# Patient Record
Sex: Male | Born: 1985 | Race: White | Hispanic: No | Marital: Married | Smoking: Never smoker
Health system: Southern US, Community
[De-identification: ages and names within clinical notes are randomized; demographics above are authoritative.]

---

## 2006-05-18 ENCOUNTER — Ambulatory Visit: Payer: Self-pay | Admitting: Urology

## 2006-05-23 ENCOUNTER — Ambulatory Visit: Payer: Self-pay | Admitting: Urology

## 2006-05-26 ENCOUNTER — Ambulatory Visit: Payer: Self-pay | Admitting: Urology

## 2006-05-31 ENCOUNTER — Ambulatory Visit: Payer: Self-pay | Admitting: Urology

## 2006-06-24 ENCOUNTER — Ambulatory Visit: Payer: Self-pay | Admitting: Urology

## 2007-04-24 IMAGING — CR DG ABDOMEN 1V
1 series · 1 of 1 positions shown · non-contrast
Comparison: none

REASON FOR EXAM: NEPHROLITHIASIS PT NEED FILMS
COMMENTS:

[view not recorded]
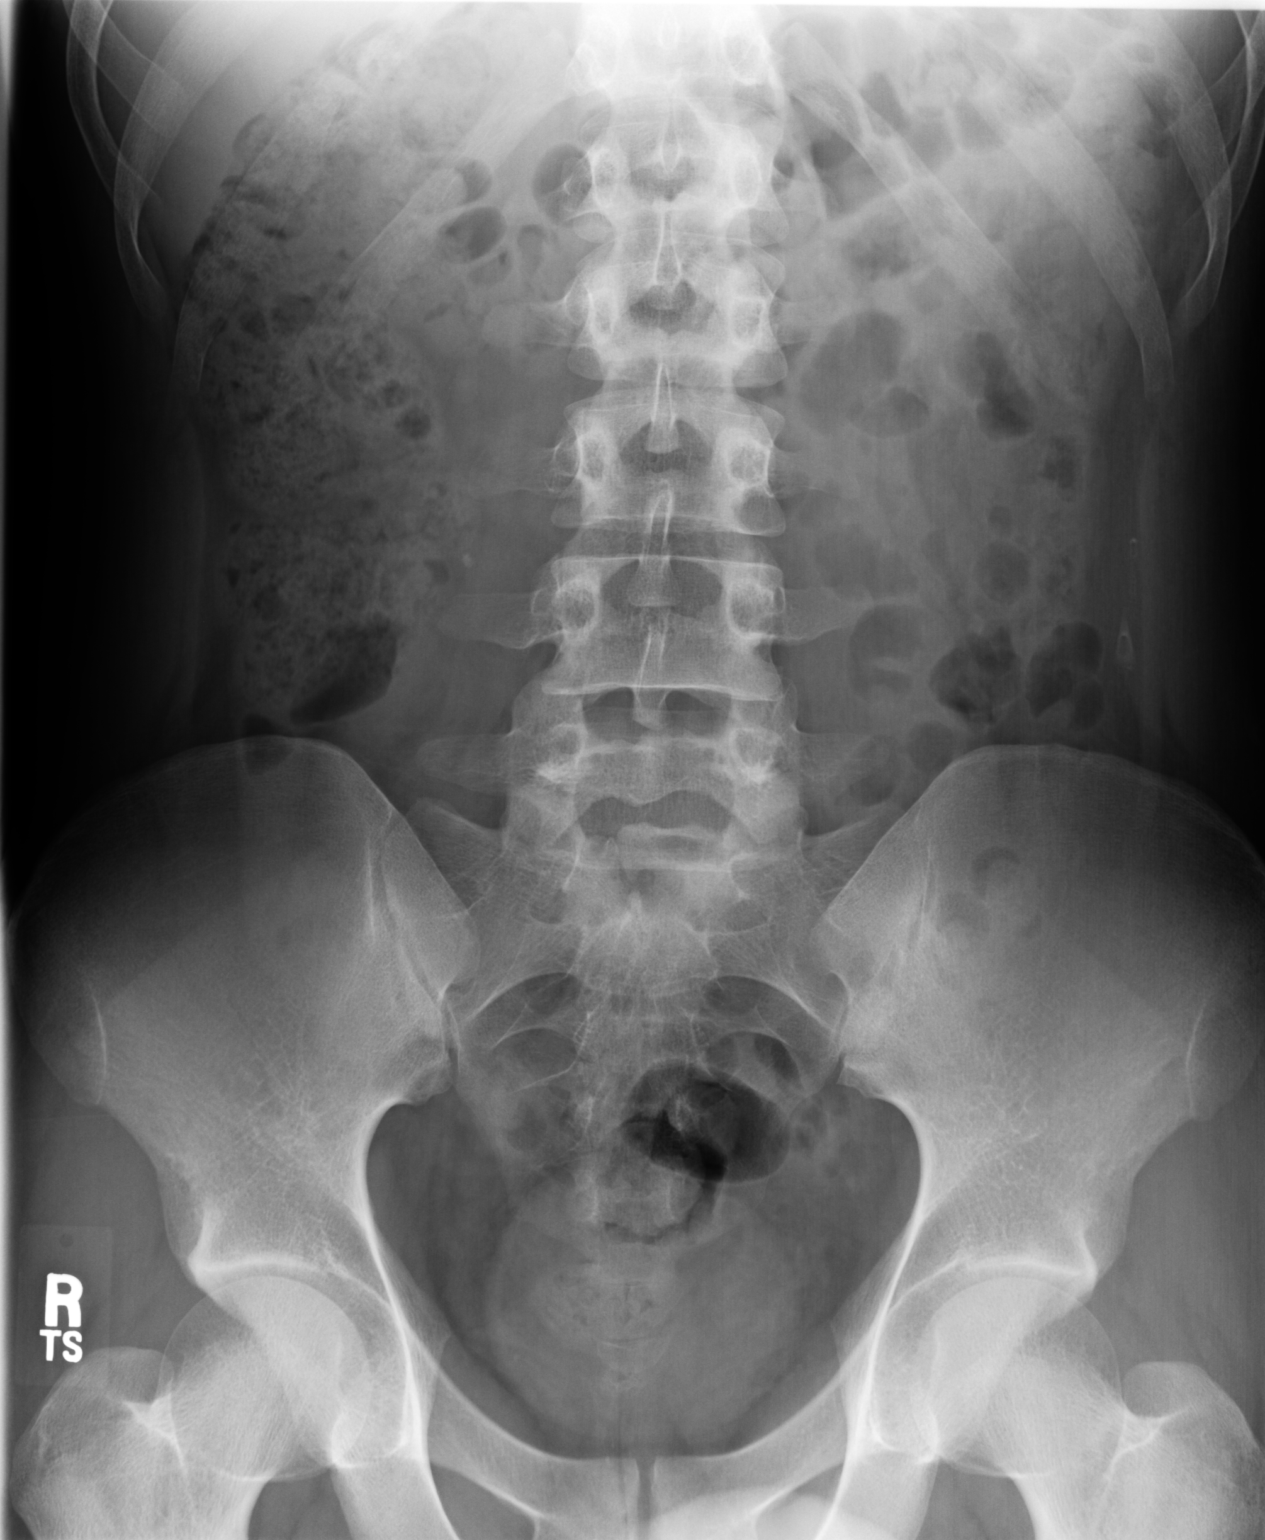

[1 of 1 positions shown; findings below may reference images not displayed]

PROCEDURE:     DXR - DXR KIDNEY URETER BLADDER  - May 18, 2006  [DATE]

RESULT:       AP view of the abdomen shows a tiny calcific density superior
to the  L4 lumbar transverse process on the RIGHT.  The etiology for this is
uncertain but the density could represent a tiny RIGHT ureteral stone.  The
RIGHT kidney is for the most part obscured by overlying bowel and bowel
content.  No definite RIGHT renal calyceal stones are seen but stones could
be present and obscured by bowel and bowel content.  There is similar
limitation and visualization of the LEFT renal area.  On this exam, no
definite LEFT renal stones are seen.
IMPRESSION: 1.     Possible proximal RIGHT ureteral stone.
2.     The kidneys are in great part obscured on this exam.  No definite
renal stones are seen but faint stones cannot be excluded.

## 2007-05-02 IMAGING — CR DG ABDOMEN 1V
1 series · 1 of 1 positions shown · non-contrast
Comparison: none

REASON FOR EXAM: renal calculi-lithotripsy
COMMENTS:

[view not recorded]
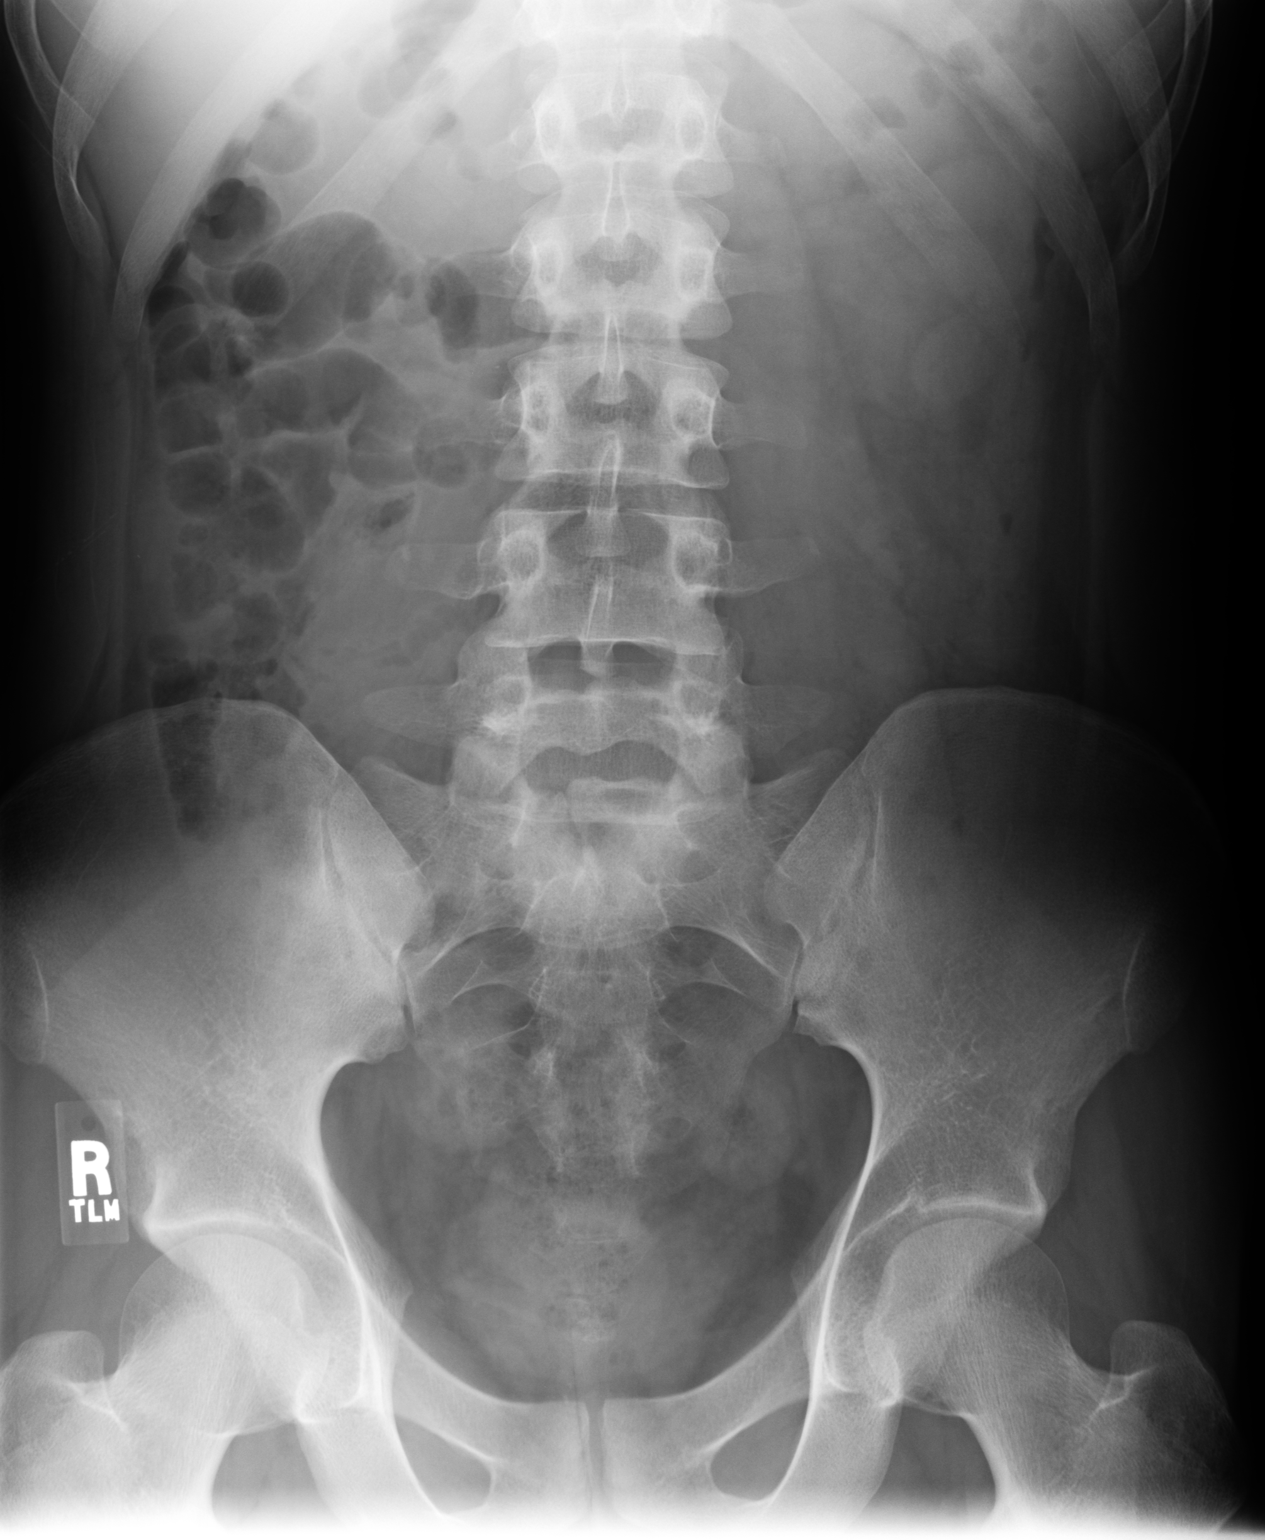

[1 of 1 positions shown; findings below may reference images not displayed]

PROCEDURE:     DXR - DXR KIDNEY URETER BLADDER  - May 26, 2006  [DATE]

RESULT:     Comparison is made to study 23 May, 2006.  The patient has
undergone lithotripsy.

There is a faint calcification lateral to the transverse process on the
RIGHT of L2.  This calcification measures approximately 1 cm in diameter.
There is a fainter calcification that projects over the RIGHT transverse
process of the fourth vertebral body.  I do not see definite calcifications
in the pelvis.
IMPRESSION: 1)There is calcification that projects in the region of the RIGHT renal
pelvis just lateral and inferior to the RIGHT transverse process of L2.  An
additional calcification projects over the RIGHT transverse process of L4.

## 2007-05-31 IMAGING — US US RENAL KIDNEY
1 series · 14 of 25 positions shown · non-contrast
Comparison: none

REASON FOR EXAM: Right renal calculus
COMMENTS:

[Series 1: us renal kidney · 0.27mm/px · 14 of 29 slices shown]
[im 1/29]
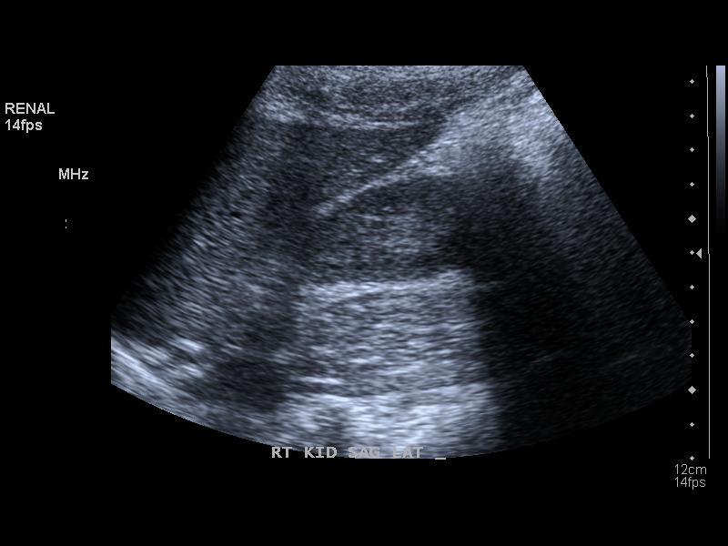
[im 3/29]
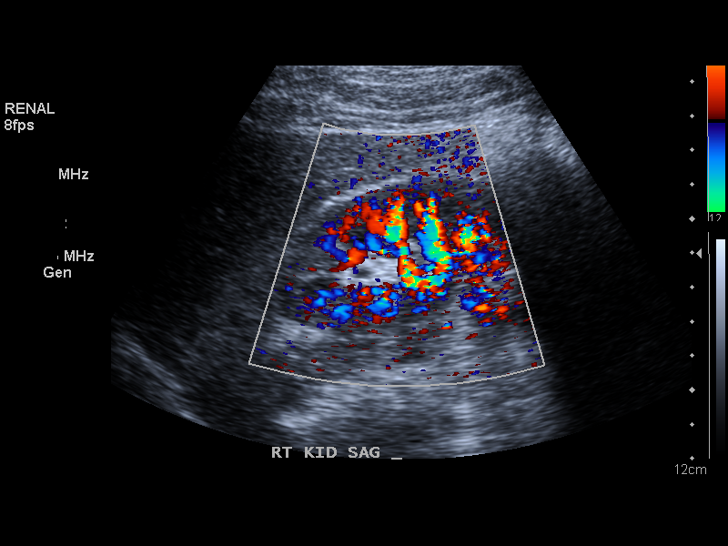
[im 5/29]
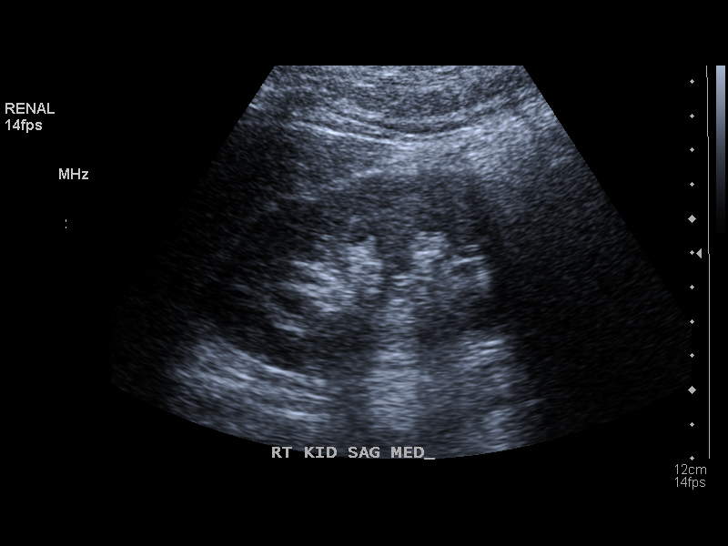
[im 8/29]
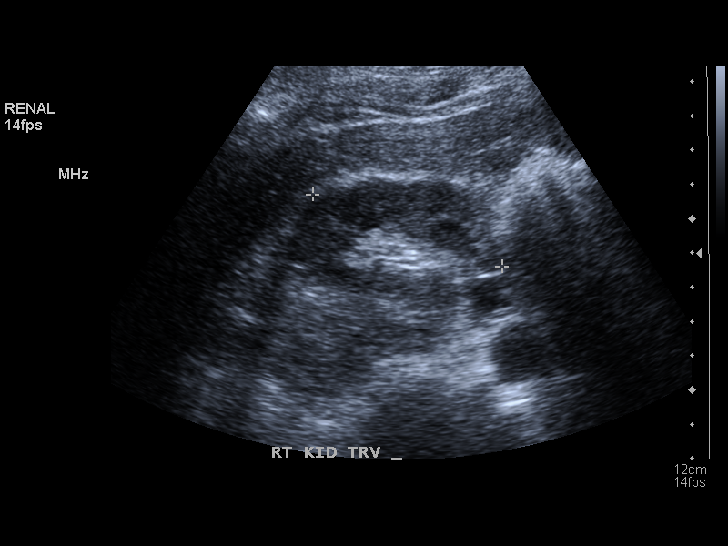
[im 10/29]
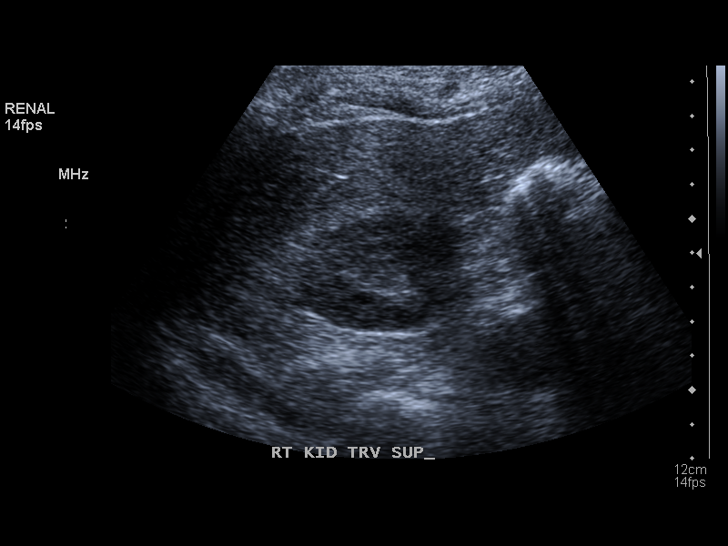
[im 11/29]
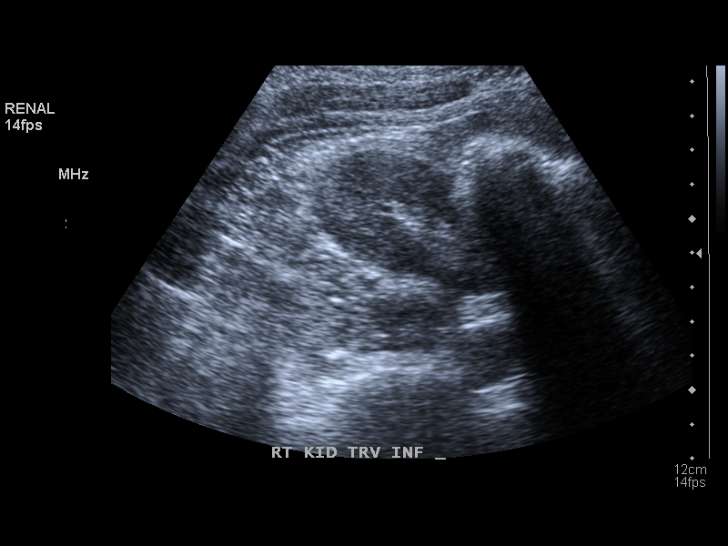
[im 13/29]
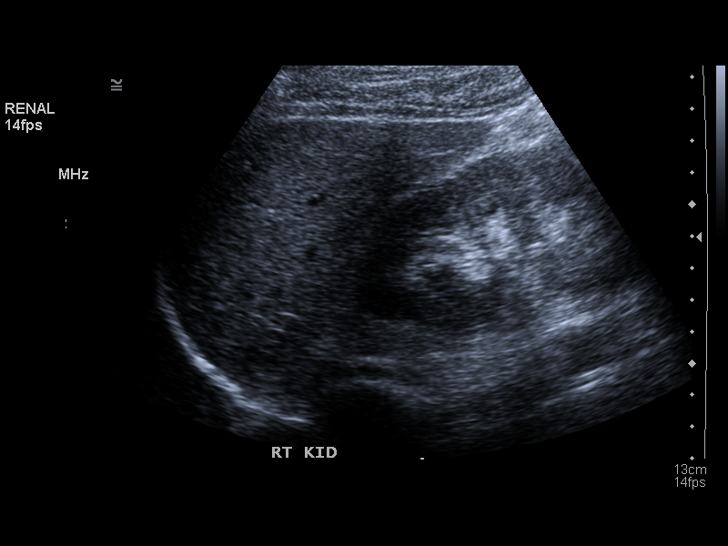
[im 16/29]
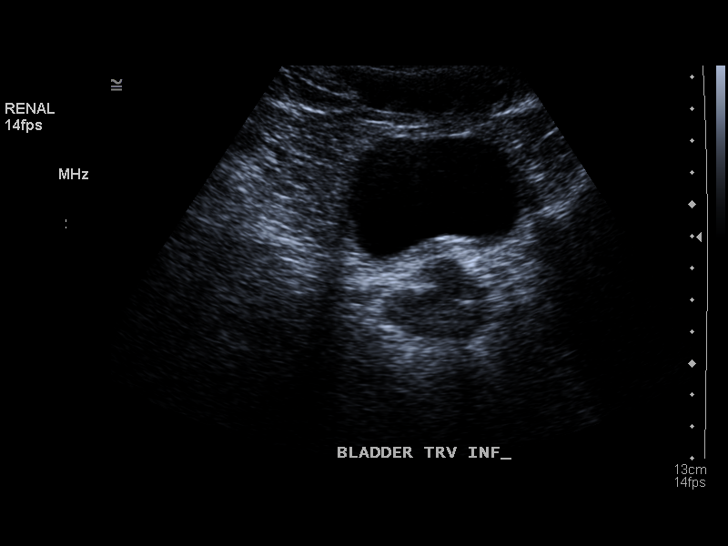
[im 18/29]
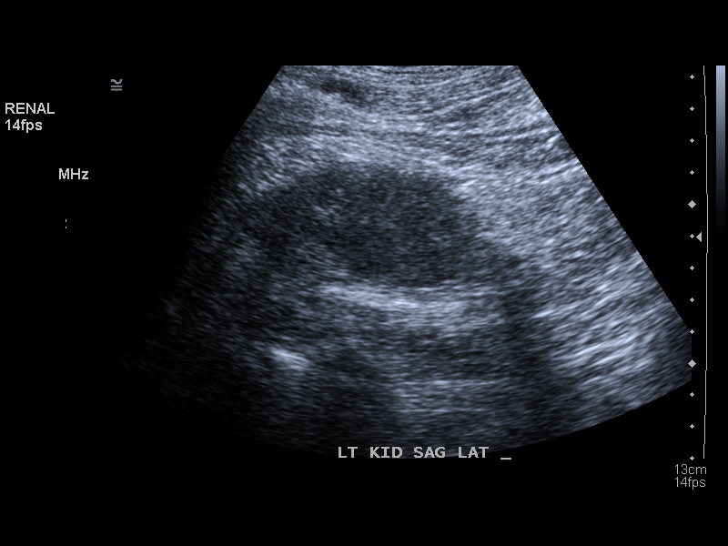
[im 19/29]
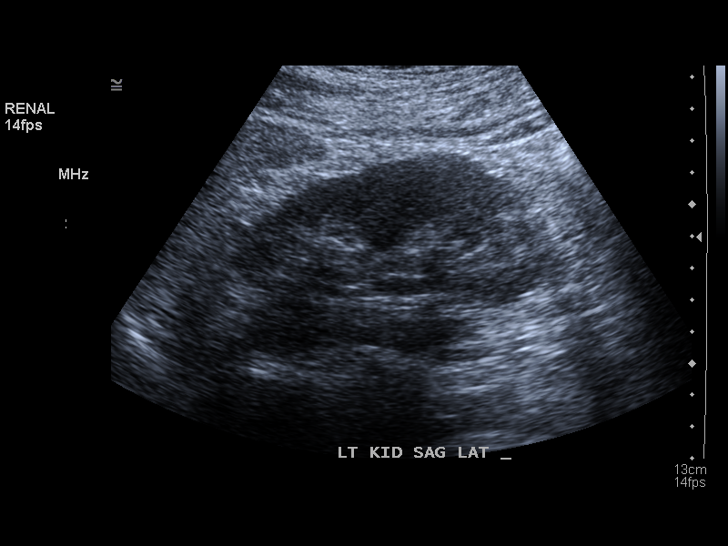
[im 22/29]
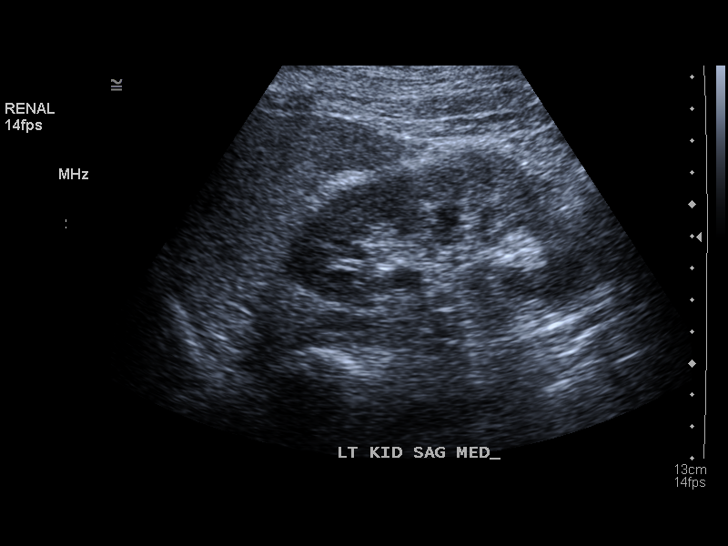
[im 24/29]
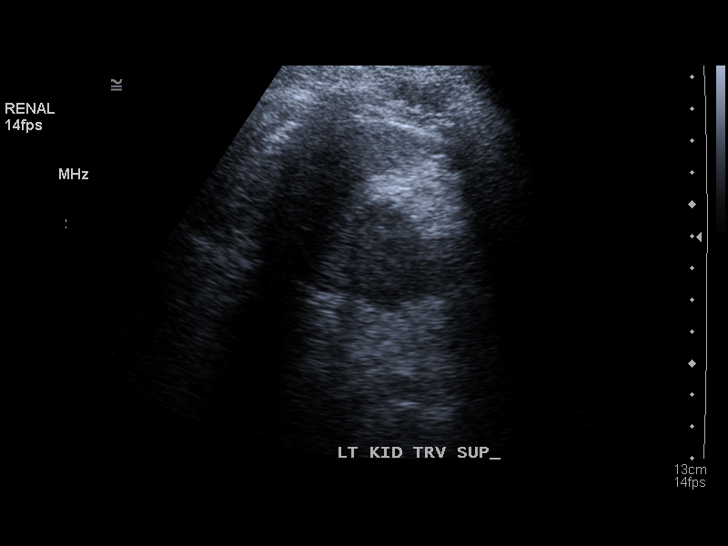
[im 26/29]
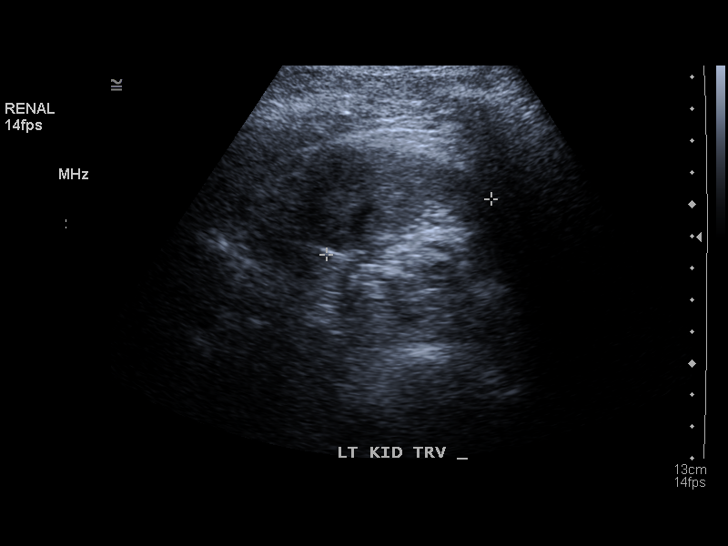
[im 29/29]
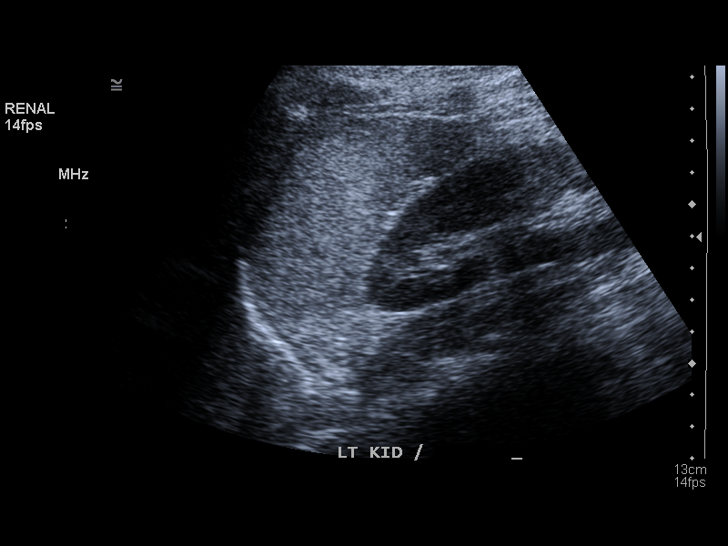

[14 of 25 positions shown; findings below may reference images not displayed]

PROCEDURE:     US  - US KIDNEY BILATERAL  - June 24, 2006  [DATE]

RESULT:        The RIGHT kidney measures 10.10 cm x 5.91 cm x 5.23 cm and
the LEFT kidney measures 10.12 cm x 5.47 cm x 4.93 cm.  No definite renal
calcifications are identified sonographically.   No mass lesions are seen.
There is no hydronephrosis.  The tiny density observed on plain film
examination in the RIGHT pelvis may be unrelated to the urinary tract since
no hydronephrosis on the RIGHT is seen.  The visualized portion of the
urinary bladder is normal in appearance.
IMPRESSION: No significant abnormalities are noted.

## 2007-08-04 ENCOUNTER — Ambulatory Visit: Payer: Self-pay | Admitting: Urology

## 2011-04-28 ENCOUNTER — Other Ambulatory Visit: Payer: Self-pay | Admitting: Family Medicine

## 2011-05-17 ENCOUNTER — Emergency Department: Payer: Self-pay | Admitting: Emergency Medicine

## 2014-03-25 ENCOUNTER — Ambulatory Visit: Payer: Self-pay | Admitting: Gastroenterology

## 2014-03-26 LAB — PATHOLOGY REPORT

## 2019-03-01 ENCOUNTER — Ambulatory Visit (INDEPENDENT_AMBULATORY_CARE_PROVIDER_SITE_OTHER): Payer: Self-pay | Admitting: Physician Assistant

## 2019-03-01 ENCOUNTER — Other Ambulatory Visit: Payer: Self-pay

## 2019-03-01 VITALS — BP 122/78 | HR 87 | Temp 98.1°F | Resp 16 | Wt 188.0 lb

## 2019-03-01 DIAGNOSIS — J22 Unspecified acute lower respiratory infection: Secondary | ICD-10-CM

## 2019-03-01 DIAGNOSIS — R6889 Other general symptoms and signs: Secondary | ICD-10-CM

## 2019-03-01 LAB — POCT INFLUENZA A/B
INFLUENZA A, POC: NEGATIVE
Influenza B, POC: NEGATIVE

## 2019-03-01 MED ORDER — PSEUDOEPH-BROMPHEN-DM 30-2-10 MG/5ML PO SYRP: 5.0000 mL | ORAL_SOLUTION | Freq: Four times a day (QID) | ORAL | 0 refills | Status: AC | PRN

## 2019-03-01 MED ORDER — DOXYCYCLINE HYCLATE 100 MG PO TABS: 100.0000 mg | ORAL_TABLET | Freq: Two times a day (BID) | ORAL | 0 refills | Status: AC

## 2019-03-01 NOTE — Progress Notes (Signed)
Patient ID: Jose Carlson DOB: [birth year] AGE: 33 y.o. MRN: [id number]   PCP: No primary care provider on file.   Chief Complaint:  Chief Complaint  Patient presents with  . Cough    x4d  . Fever    x4d  . Fatigue    x4d  . Chills    x4d     Subjective:    HPI:  Jose Carlson is a 33 y.o. male presents for evaluation  Chief Complaint  Patient presents with  . Cough    x4d  . Fever    x4d  . Fatigue    x4d  . Chills    x8d    33 year old male presents to [facility] with 5 day history of flu-like symptoms. Began with malaise and fatigue. Suspected initially just due to working a long shift. Then developed fever. Tmax 100.27F. Associated sweats, chills, headache, body aches, lack of appetite, rhinorrhea, nasal congestion, and cough. Cough coarse. Associated chest congestion. Cough rarely productive. Patient was feeling slightly better; temperature 64F for a few days. Continued cough. Then this morning fever of 100.27F again. Diaphoretic. Feels worse. Has taken OTC Tylenol and Robitussin with minimal relief; last dose of Tylenol 10am (2-1/2 hours ago). Denies dizziness/lightheadedness, ear pain, sinus pain, sore throat, chest pain, SOB, wheezing, vomiting, diarrhea, abdominal pain.  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 No known close contact specifically with influenza. Patient did receive this season's influenza vaccination. Patient states he had influenza last year and the year previously; resolved within a few days. Patient denies any recent travel or known exposure to Covid-19 patient.  Patient generally healthy. No medical conditions. Does not take any OTC or prescription medications regularly. No pulmonary history. No smoking history. No asthma history. Patient denies any immunocompromising disorder. No kidney or liver disease.  A limited review of symptoms was performed, pertinent positives and negatives as mentioned in HPI.  The  following portions of the patient's history were reviewed and updated as appropriate: allergies, current medications and past medical history.  There are no active problems to display for this patient.   No Known Allergies  No current outpatient medications on file prior to visit.   No current facility-administered medications on file prior to visit.        Objective:   Vitals:   03/01/19 1224  BP: 122/78  Pulse: 87  Resp: 16  Temp: 98.1 F (36.7 C)  SpO2: 98%     Wt Readings from Last 3 Encounters:  03/01/19 188 lb (85.3 kg)    Physical Exam:   General Appearance:  Patient sitting comfortably on examination table. Conversational. Peri Jefferson self-historian. In no acute distress. Afebrile. Diaphoretic.  Head:  Normocephalic, without obvious abnormality, atraumatic  Eyes:  PERRL, conjunctiva/corneas clear, EOM's intact  Ears:  Left ear canal WNL. No erythema or edema. No open wound. No visible purulent drainage. No tenderness with palpation over left tragus or with manipulation of left auricle. No visible erythema or edema of left mastoid. No tenderness with palpation over left mastoid. Right ear canal WNL. No erythema or edema. No open wound. No visible purulent drainage. No tenderness with palpation over right tragus or with manipulation of right auricle. No visible erythema or edema of right mastoid. No tenderness with palpation over right mastoid. Left TM WNL. Good light reflex. Visible landmarks. No erythema. No injection. No bulging or retraction. No visible perforation. No serous effusion. No visible purulent effusion. No tympanostomy  tube. No scar tissue. Right TM WNL. Good light reflex. Visible landmarks. No erythema. No injection. No bulging or retraction. No visible perforation. No serous effusion. No visible purulent effusion. No tympanostomy tube. No scar tissue.  Nose: Nares normal. Septum midline. No visible polyps. Nasal mucosa with minimal edema. Scant clear  rhinorrhea. No sinus tenderness with percussion/palpation.  Throat: Lips, mucosa, and tongue normal; teeth and gums normal. Throat reveals no erythema. No postnasal drip. No visible cobblestoning. Tonsils with no enlargement or exudate. Uvula midline with no edema or erythema.  Neck: Supple, symmetrical, trachea midline, no lymphadenopathy  Lungs:   Clear to auscultation bilaterally, respirations unlabored. 98% pulse ox. Good aeration. No rales, rhonchi, crackles or wheezing. Frequent dry cough during examination.  Heart:  Regular rate and rhythm, S1 and S2 normal, no murmur, rub, or gallop  Extremities: Extremities normal, atraumatic, no cyanosis or edema  Pulses: 2+ and symmetric  Skin: Skin color, texture, turgor normal, no rashes or lesions  Lymph nodes: Cervical, supraclavicular, and axillary nodes normal  Neurologic: Normal    Assessment & Plan:    Exam findings, diagnosis etiology and medication use and indications reviewed with patient. Follow-Up and discharge instructions provided. No emergent/urgent issues found on exam.  Patient education was provided.   Patient verbalized understanding of information provided and agrees with plan of care (POC), all questions answered. The patient is advised to call or return to clinic if condition does not see an improvement in symptoms, or to seek the care of the closest emergency department if condition worsens with the below plan.    1. Lower respiratory tract infection - doxycycline (VIBRA-TABS) 100 MG tablet; Take 1 tablet (100 mg total) by mouth 2 (two) times daily for 7 days.  Dispense: 14 tablet; Refill: 0 - brompheniramine-pseudoephedrine-DM 30-2-10 MG/5ML syrup; Take 5 mLs by mouth 4 (four) times daily as needed.  Dispense: 120 mL; Refill: 0  2. Flu-like symptoms - POCT Influenza A/B   Patient with five day history of flu-like symptoms. Rapid flu test performed in clinic today negative. Sudden onset fatigue, malaise, fever, and URI  symptoms (specifically cough). Symptoms improving, then returned more severe. Double sickness suggestive of secondary bacterial infection. Suspect patient had influenza or other viral URI, then developed bacterial lower respiratory infection. VSS, afebrile (antipyretic 2 hours ago), in no acute distress, clear lung sounds. Patient with no complicating medical history. Prescribed patient Doxycycline 100mg  bid x 7 days and Bromfed cough syrup. Advised rest, increase fluids, and OTC antipyretic. Discussed concern for flu complications; advised immediate evaluation at ED with worsening symptoms. Advised evaluation by PCP, urgent care, or ED in 2 days if symptoms not improving, at that time believe further work up warranted (CXR, bloodwork, etc). Patient agrees with plan.   Janalyn Harder, MHS, PA-C Rulon Sera, MHS, PA-C Advanced Practice Provider [facility]  [address] [facility], 1st Floor [place], [place] [address] (p):  [phone] [email or web address]

## 2019-03-01 NOTE — Patient Instructions (Addendum)
Thank you for choosing [facility] for your health care needs.  You have been diagnosed with lower respiratory tract infection (bronchitis and/or pneumonia).  Concern you initially had influenza, then developed secondary bacterial infection.  Take medication as prescribed: Meds ordered this encounter  Medications  . doxycycline (VIBRA-TABS) 100 MG tablet    Sig: Take 1 tablet (100 mg total) by mouth 2 (two) times daily for 7 days.    Dispense:  14 tablet    Refill:  0    Order Specific Question:   Supervising Provider    Answer:   MILLER, BRIAN [[id number]]  . brompheniramine-pseudoephedrine-DM 30-2-10 MG/5ML syrup    Sig: Take 5 mLs by mouth 4 (four) times daily as needed.    Dispense:  120 mL    Refill:  0    Order Specific Question:   Supervising Provider    Answer:   Hyacinth Meeker, BRIAN [[id number]]   Increase fluids. Rest. May use over the counter Tylenol or ibuprofen for fever.  Follow-up immediately at the ED if you develop chest pain, shortness of breath, difficulty breathing, or other new/concerning symptom.  Go to your PCP, the urgent care, or the ED in two days if symptoms not improving; specifically if fever not resolved, at that time further evaluation is needed.  Acute Bronchitis, Adult Acute bronchitis is when air tubes (bronchi) in the lungs suddenly get swollen. The condition can make it hard to breathe. It can also cause these symptoms:  A cough.  Coughing up clear, yellow, or green mucus.  Wheezing.  Chest congestion.  Shortness of breath.  A fever.  Body aches.  Chills.  A sore throat. Follow these instructions at home:  Medicines  Take over-the-counter and prescription medicines only as told by your doctor.  If you were prescribed an antibiotic medicine, take it as told by your doctor. Do not stop taking the antibiotic even if you start to feel better. General instructions  Rest.  Drink enough fluids to keep your pee (urine) pale yellow.  Avoid smoking  and secondhand smoke. If you smoke and you need help quitting, ask your doctor. Quitting will help your lungs heal faster.  Use an inhaler, cool mist vaporizer, or humidifier as told by your doctor.  Keep all follow-up visits as told by your doctor. This is important. How is this prevented? To lower your risk of getting this condition again:  Wash your hands often with soap and water. If you cannot use soap and water, use hand sanitizer.  Avoid contact with people who have cold symptoms.  Try not to touch your hands to your mouth, nose, or eyes.  Make sure to get the flu shot every year. Contact a doctor if:  Your symptoms do not get better in 2 weeks. Get help right away if:  You cough up blood.  You have chest pain.  You have very bad shortness of breath.  You become dehydrated.  You faint (pass out) or keep feeling like you are going to pass out.  You keep throwing up (vomiting).  You have a very bad headache.  Your fever or chills gets worse. This information is not intended to replace advice given to you by your health care provider. Make sure you discuss any questions you have with your health care provider. Document Released: 05/24/2008 Document Revised: 07/20/2017 Document Reviewed: 05/26/2016 Elsevier Interactive Patient Education  2019 [employer].

## 2019-03-05 ENCOUNTER — Telehealth: Payer: Self-pay | Admitting: Emergency Medicine

## 2019-03-05 NOTE — Telephone Encounter (Signed)
Left message following up on visit with [facility] 

## 2022-02-01 ENCOUNTER — Other Ambulatory Visit: Payer: Self-pay

## 2022-02-01 MED ORDER — SULFAMETHOXAZOLE-TRIMETHOPRIM 800-160 MG PO TABS
ORAL_TABLET | ORAL | 0 refills | Status: DC
  Filled 2022-02-01: qty 14, 7d supply, fill #0

## 2022-03-22 ENCOUNTER — Ambulatory Visit: Payer: 59 | Admitting: Urology

## 2022-03-22 ENCOUNTER — Encounter: Payer: Self-pay | Admitting: Urology

## 2022-03-22 VITALS — BP 118/83 | HR 82 | Ht 71.0 in | Wt 204.0 lb

## 2022-03-22 DIAGNOSIS — Z3009 Encounter for other general counseling and advice on contraception: Secondary | ICD-10-CM | POA: Diagnosis not present

## 2022-03-22 NOTE — Patient Instructions (Signed)

## 2022-03-22 NOTE — Progress Notes (Signed)
? ?03/22/2022 ?8:39 AM  ? ?Jose Carlson ?21-May-1986 ?[id number] ? ?Referring provider: Alm Bustard, NP ?[address] ?[place],  [place] [address] ? ?Chief Complaint  ?Patient presents with  ? VAS Consult  ? ? ?HPI: ?Jose Carlson is a 36 y.o. male who presents for vasectomy counseling. ? ?Engaged to be married but has no children.  States he and his fianc?e have discussed in detail and do not desire to have children ?Prior urologic history remarkable for nephrolithiasis needing SWL ~ 15 years ago but no chronic scrotal pain, epididymitis or orchitis ?No previous history inguinal hernia or pelvic surgery ?No history of bleeding or clotting disorders ? ? ?PMH: ? Anxiety  ? Depression  ? Kidney stones  ? ?Surgical History: ?Shockwave lithotripsy 2009 ? ?Home Medications:  ?Allergies as of 03/22/2022   ?No Known Allergies ?  ? ?  ?Medication List  ?  ? ?  ? Accurate as of March 22, 2022  8:39 AM. If you have any questions, ask your nurse or doctor.  ?  ?  ? ?  ? ?STOP taking these medications   ? ?sulfamethoxazole-trimethoprim 800-160 MG tablet ?Commonly known as: BACTRIM DS ?Stopped by: Riki Altes, MD ?  ? ?  ? ?TAKE these medications   ? ?brompheniramine-pseudoephedrine-DM 30-2-10 MG/5ML syrup ?Take 5 mLs by mouth 4 (four) times daily as needed. ?  ? ?  ? ? ?Allergies: No Known Allergies ? ?Family History: ?No family history on file. ? ?Social History:  reports that he has never smoked. He has never used smokeless tobacco. He reports current alcohol use. No history on file for drug use. ? ? ?Physical Exam: ?BP 118/83   Pulse 82   Ht 5\' 11"  (1.803 m)   Wt 204 lb (92.5 kg)   BMI 28.45 kg/m?   ?Constitutional:  Alert and oriented, No acute distress. ?HEENT: [place] AT, moist mucus membranes.  Trachea midline, no masses. ?Cardiovascular: No clubbing, cyanosis, or edema. ?Respiratory: Normal respiratory effort, no increased work of breathing. ?GI: Abdomen is soft, nontender, nondistended, no abdominal  masses ?GU: Phallus without lesions, testes descended bilaterally without masses or tenderness, spermatic cord/epididymis palpably normal bilaterally.  Thick spermatic cords but vasa palpable bilaterally ?Skin: No rashes, bruises or suspicious lesions. ?Neurologic: Grossly intact, no focal deficits, moving all 4 extremities. ?Psychiatric: Normal mood and affect. ? ? ?Assessment & Plan:   ? ?1.  Undesired fertility ?Desires to schedule vasectomy ?We had a long discussion about vasectomy. We specifically discussed the procedure, recovery and the risks, benefits and alternatives of vasectomy. I explained that the procedure entails removal of a segment of each vas deferens, each of which conducts sperm, and that the purpose of this procedure is to cause sterility (inability to produce children or cause pregnancy). Vasectomy is intended to be permanent and irreversible form of contraception. Options for fertility after vasectomy include vasectomy reversal, or sperm retrieval with in vitro fertilization. These options are not always successful, and they may be expensive. We discussed reversible forms of birth control such as condoms, IUD or diaphragms, as well as the option of freezing sperm in a sperm bank prior to the vasectomy procedure. We discussed the importance of avoiding strenuous exercise for four days after vasectomy, and the importance of refraining from any form of ejaculation for seven days after vasectomy. I explained that vasectomy does not produce immediate sterility so another form of contraceptive must be used until sterility is assured by having semen checked  for sperm. Thus, a post vasectomy semen analysis is necessary to confirm sterility. Rarely, vasectomy must be repeated. We discussed the approximately 1 in 2,000 risk of pregnancy after vasectomy for men who have post-vasectomy semen analysis showing absent sperm or rare non-motile sperm. Typical side effects include a small amount of oozing  blood, some discomfort and mild swelling in the area of incision.  Vasectomy does not affect sexual performance, function, please, sensation, interest, desire, satisfaction, penile erection, volume of semen or ejaculation. Other rare risks include allergy or adverse reaction to an anesthetic, testicular atrophy, hematoma, infection/abscess, prolonged tenderness of the vas deferens, pain, swelling, painful nodule or scar (called sperm granuloma) or epididymtis. We discussed chronic testicular pain syndrome. This has been reported to occur in as many as 1-2% of men and may be permanent. This can be treated with medication, small procedures or (rarely) surgery. ?He indicated he would call back if he desires a preprocedure anxiolytic and would need a driver if utilizing ? ? ?Riki Altes, MD ? ?[facility] ?[address] ?[place], [place] [address] ?[phone] ?

## 2022-03-24 ENCOUNTER — Encounter: Payer: Self-pay | Admitting: Urology

## 2022-03-24 ENCOUNTER — Telehealth: Payer: Self-pay | Admitting: Urology

## 2022-03-24 NOTE — Telephone Encounter (Signed)
LMOM for pt to either bring insurance card by office OR join [facility] and download card. ?

## 2022-03-31 ENCOUNTER — Other Ambulatory Visit: Payer: Self-pay

## 2022-03-31 MED ORDER — CLOBETASOL PROPIONATE 0.05 % EX OINT
TOPICAL_OINTMENT | CUTANEOUS | 1 refills | Status: AC
  Filled 2022-03-31: qty 30, 30d supply, fill #0

## 2022-04-01 ENCOUNTER — Other Ambulatory Visit: Payer: Self-pay

## 2022-04-02 ENCOUNTER — Other Ambulatory Visit: Payer: Self-pay

## 2022-05-21 ENCOUNTER — Encounter: Payer: 59 | Admitting: Urology

## 2022-07-23 ENCOUNTER — Encounter: Payer: 59 | Admitting: Urology

## 2022-08-20 ENCOUNTER — Encounter: Payer: 59 | Admitting: Urology

## 2022-08-20 ENCOUNTER — Other Ambulatory Visit: Payer: Self-pay

## 2022-08-20 ENCOUNTER — Ambulatory Visit (INDEPENDENT_AMBULATORY_CARE_PROVIDER_SITE_OTHER): Payer: 59 | Admitting: Urology

## 2022-08-20 ENCOUNTER — Encounter: Payer: Self-pay | Admitting: Urology

## 2022-08-20 VITALS — BP 133/72 | HR 79 | Ht 71.0 in | Wt 208.0 lb

## 2022-08-20 DIAGNOSIS — Z9852 Vasectomy status: Secondary | ICD-10-CM

## 2022-08-20 DIAGNOSIS — Z302 Encounter for sterilization: Secondary | ICD-10-CM | POA: Diagnosis not present

## 2022-08-20 MED ORDER — HYDROCODONE-ACETAMINOPHEN 5-325 MG PO TABS
1.0000 | ORAL_TABLET | ORAL | 0 refills | Status: AC | PRN
  Filled 2022-08-20: qty 8, 2d supply, fill #0

## 2022-08-20 NOTE — Progress Notes (Unsigned)
Vasectomy Procedure Note  Indications: Jose Carlson is a 36 y.o. male who presents today for elective sterilization.  He has been consented for the procedure.  He is aware of the risks and benefits.  He had no additional questions.  He agrees to proceed.  He denies any other significant change since his last visit.  Pre-operative Diagnosis: Elective sterilization  Post-operative Diagnosis: Elective sterilization  Premedication: Valium 10 mg po  Surgeon: Lorin Picket C. Jaylanni Eltringham, M.D  Description: The patient was prepped and draped in the standard fashion.  The right vas deferens was identified and brought superiorly to the anterior scrotal skin.  The skin and vas were then anesthetized utilizing *** ml 1% lidocaine.  A small stab incision was made and spread with the vas dissector.  The vas was grasped utilizing the vas clamp and elevated out of the incision.  The vas was dissected free from surrounding tissue and vessels and an ~1 cm segment was excised.  The vas lumens were cauterized utilizing electrocautery.  The distal segment was buried in the surrounding sheath with a 3-0 chromic suture.  No significant bleeding was observed.  The vas ends were then dropped back into the hemiscrotum.  The skin was closed with hemostatic pressure.  An identical procedure was performed on the contralateral side.  Clean dry gauze was applied to the incision sites.  The patient tolerated the procedure well.  Complications:None  Recommendations: 1.  No lifting greater than 10 pounds or strenuous activity for 1 week. 2.  Scrotal support for 1-2 weeks. 3.  May shower in 24 hours; no bath, hot tub for 1 week 4.  No intercourse for at least 7 days and resume based on level of discomfort  5.  Continue alternate contraception for 12 weeks.  6.  Call for significant pain, swelling, redness, drainage or fever greater than 100.5. 7.  Rx hydrocodone/APAP 5/325 1-2 every 6 hours prn pain. 8.  Follow-up semen analysis in  12 weeks.   Irineo Axon, MD

## 2022-08-20 NOTE — Patient Instructions (Signed)

## 2022-11-24 ENCOUNTER — Other Ambulatory Visit: Payer: 59

## 2022-11-24 DIAGNOSIS — Z9852 Vasectomy status: Secondary | ICD-10-CM

## 2022-11-25 LAB — POST-VAS SPERM EVALUATION,QUAL: Volume: 0.5 mL

## 2022-11-26 ENCOUNTER — Encounter: Payer: Self-pay | Admitting: Urology

## 2023-04-06 ENCOUNTER — Other Ambulatory Visit: Payer: Self-pay

## 2023-04-06 DIAGNOSIS — D2271 Melanocytic nevi of right lower limb, including hip: Secondary | ICD-10-CM | POA: Diagnosis not present

## 2023-04-06 DIAGNOSIS — D225 Melanocytic nevi of trunk: Secondary | ICD-10-CM | POA: Diagnosis not present

## 2023-04-06 DIAGNOSIS — D2261 Melanocytic nevi of right upper limb, including shoulder: Secondary | ICD-10-CM | POA: Diagnosis not present

## 2023-04-06 DIAGNOSIS — L400 Psoriasis vulgaris: Secondary | ICD-10-CM | POA: Diagnosis not present

## 2023-04-06 DIAGNOSIS — D2272 Melanocytic nevi of left lower limb, including hip: Secondary | ICD-10-CM | POA: Diagnosis not present

## 2023-04-06 MED ORDER — CLOBETASOL PROPIONATE 0.05 % EX OINT
1.0000 | TOPICAL_OINTMENT | Freq: Two times a day (BID) | CUTANEOUS | 1 refills | Status: AC
  Filled 2023-04-06: qty 30, 30d supply, fill #0
